# Patient Record
Sex: Female | Born: 1955 | Race: Black or African American | Hispanic: No | Marital: Single
Health system: Southern US, Community
[De-identification: ages and names within clinical notes are randomized; demographics above are authoritative.]

---

## 2011-10-21 ENCOUNTER — Ambulatory Visit: Payer: Self-pay | Admitting: Internal Medicine

## 2011-11-17 ENCOUNTER — Inpatient Hospital Stay: Payer: Self-pay | Admitting: Internal Medicine

## 2011-11-17 LAB — CBC
HGB: 7.8 g/dL — ABNORMAL LOW (ref 12.0–16.0)
MCHC: 32.3 g/dL (ref 32.0–36.0)
MCV: 124 fL — ABNORMAL HIGH (ref 80–100)
Platelet: 15 10*3/uL — CL (ref 150–440)
WBC: 6.2 10*3/uL (ref 3.6–11.0)

## 2011-11-17 LAB — COMPREHENSIVE METABOLIC PANEL
Albumin: 2.5 g/dL — ABNORMAL LOW (ref 3.4–5.0)
Alkaline Phosphatase: 46 U/L — ABNORMAL LOW (ref 50–136)
Anion Gap: 12 (ref 7–16)
Creatinine: 1.07 mg/dL (ref 0.60–1.30)
EGFR (African American): >60
EGFR (Non-African Amer.): 59 — ABNORMAL LOW
Glucose: 138 mg/dL — ABNORMAL HIGH (ref 65–99)
Osmolality: 303 (ref 275–301)
Potassium: 4.1 mmol/L (ref 3.5–5.1)
SGOT(AST): 18 U/L (ref 15–37)
SGPT (ALT): 24 U/L
Total Protein: 5.8 g/dL — ABNORMAL LOW (ref 6.4–8.2)

## 2011-11-17 LAB — URINALYSIS, COMPLETE
Bilirubin,UR: NEGATIVE
Ketone: NEGATIVE
Nitrite: POSITIVE
Ph: 6 (ref 4.5–8.0)
Protein: NEGATIVE
Squamous Epithelial: 2
WBC UR: 106 /HPF (ref 0–5)

## 2011-11-17 LAB — TROPONIN I
Troponin-I: 0.08 ng/mL — ABNORMAL HIGH
Troponin-I: 0.07 ng/mL — ABNORMAL HIGH

## 2011-11-17 LAB — DIFFERENTIAL
Bands: 1 %
Eosinophil: 1 %
Lymphocytes: 52 %
NRBC/100 WBC: 4 /
Segmented Neutrophils: 45 %

## 2011-11-17 LAB — CK TOTAL AND CKMB (NOT AT ARMC)
CK-MB: 0.8 ng/mL (ref 0.5–3.6)
CK-MB: 1 ng/mL (ref 0.5–3.6)

## 2011-11-17 LAB — APTT: Activated PTT: 24.8 secs (ref 23.6–35.9)

## 2011-11-17 LAB — PROTIME-INR: Prothrombin Time: 13.8 secs (ref 11.5–14.7)

## 2011-11-17 LAB — PRO B NATRIURETIC PEPTIDE: B-Type Natriuretic Peptide: 665 pg/mL — ABNORMAL HIGH (ref 0–125)

## 2011-11-18 LAB — COMPREHENSIVE METABOLIC PANEL
Albumin: 2.3 g/dL — ABNORMAL LOW (ref 3.4–5.0)
Alkaline Phosphatase: 45 U/L — ABNORMAL LOW (ref 50–136)
Anion Gap: 12 (ref 7–16)
BUN: 35 mg/dL — ABNORMAL HIGH (ref 7–18)
Calcium, Total: 7.4 mg/dL — ABNORMAL LOW (ref 8.5–10.1)
Chloride: 112 mmol/L — ABNORMAL HIGH (ref 98–107)
Glucose: 280 mg/dL — ABNORMAL HIGH (ref 65–99)
SGPT (ALT): 18 U/L

## 2011-11-18 LAB — CK TOTAL AND CKMB (NOT AT ARMC)
CK, Total: 47 U/L (ref 21–215)
CK-MB: 1 ng/mL (ref 0.5–3.6)

## 2011-11-18 LAB — CBC WITH DIFFERENTIAL/PLATELET
Bands: 11 %
Comment - H1-Com4: NORMAL
HCT: 25.7 % — ABNORMAL LOW (ref 35.0–47.0)
HGB: 8.7 g/dL — ABNORMAL LOW (ref 12.0–16.0)
Lymphocytes: 12 %
MCH: 34.1 pg — ABNORMAL HIGH (ref 26.0–34.0)
MCHC: 34 g/dL (ref 32.0–36.0)
MCV: 100 fL (ref 80–100)
NRBC/100 WBC: 2 /
Platelet: 45 10*3/uL — ABNORMAL LOW (ref 150–440)
RBC: 2.56 10*6/uL — ABNORMAL LOW (ref 3.80–5.20)
RDW: 27.1 % — ABNORMAL HIGH (ref 11.5–14.5)
Segmented Neutrophils: 77 %
WBC: 3.1 10*3/uL — ABNORMAL LOW (ref 3.6–11.0)

## 2011-11-18 LAB — LIPID PANEL
HDL Cholesterol: 29 mg/dL — ABNORMAL LOW (ref 40–60)
VLDL Cholesterol, Calc: 63 mg/dL — ABNORMAL HIGH (ref 5–40)

## 2011-11-18 LAB — HEMOGLOBIN A1C: Hemoglobin A1C: 5.9 % (ref 4.2–6.3)

## 2011-11-19 LAB — CBC WITH DIFFERENTIAL/PLATELET
Basophil #: 0 10*3/uL (ref 0.0–0.1)
Basophil %: 0.9 %
Eosinophil #: 0 10*3/uL (ref 0.0–0.7)
HGB: 7.4 g/dL — ABNORMAL LOW (ref 12.0–16.0)
Lymphocyte #: 0.4 10*3/uL — ABNORMAL LOW (ref 1.0–3.6)
Lymphocyte %: 14.3 %
Monocyte #: 0.2 x10 3/mm (ref 0.2–0.9)
Monocyte %: 5.8 %
Monocytes: 6 %
Neutrophil %: 78.9 %
Platelet: 32 10*3/uL — ABNORMAL LOW (ref 150–440)
RDW: 28.7 % — ABNORMAL HIGH (ref 11.5–14.5)
WBC: 2.9 10*3/uL — ABNORMAL LOW (ref 3.6–11.0)

## 2011-11-19 LAB — IRON AND TIBC
Iron Saturation: 94 %
Unbound Iron-Bind.Cap.: 11 ug/dL

## 2011-11-19 LAB — FIBRINOGEN: Fibrinogen: 479 mg/dL — ABNORMAL HIGH (ref 210–470)

## 2011-11-19 LAB — FOLATE: Folic Acid: 22.7 ng/mL (ref 3.1–100.0)

## 2011-11-19 LAB — FIBRIN DEGRADATION PROD.(ARMC ONLY): Fibrin Degradation Prod.: <10 (ref 2.1–7.7)

## 2011-11-19 LAB — PROTIME-INR
INR: 1.1
Prothrombin Time: 14.2 secs (ref 11.5–14.7)

## 2011-11-19 LAB — APTT: Activated PTT: 28.9 secs (ref 23.6–35.9)

## 2011-11-20 LAB — CBC WITH DIFFERENTIAL/PLATELET
Bands: 6 %
HCT: 30.5 % — ABNORMAL LOW (ref 35.0–47.0)
MCH: 32.7 pg (ref 26.0–34.0)
MCHC: 34 g/dL (ref 32.0–36.0)
Monocytes: 5 %
Platelet: 22 10*3/uL — CL (ref 150–440)
Segmented Neutrophils: 70 %
WBC: 2.9 10*3/uL — ABNORMAL LOW (ref 3.6–11.0)

## 2011-11-21 ENCOUNTER — Ambulatory Visit: Payer: Self-pay | Admitting: Internal Medicine

## 2011-11-21 LAB — CBC WITH DIFFERENTIAL/PLATELET
Basophil #: 0.1 10*3/uL (ref 0.0–0.1)
Basophil %: 1.8 %
Comment - H1-Com3: NORMAL
Eosinophil %: 0.3 %
HCT: 33.7 % — ABNORMAL LOW (ref 35.0–47.0)
Lymphocyte #: 0.8 10*3/uL — ABNORMAL LOW (ref 1.0–3.6)
Lymphocyte %: 25.5 %
Lymphocytes: 29 %
MCV: 96 fL (ref 80–100)
Monocyte #: 0.3 x10 3/mm (ref 0.2–0.9)
Monocyte %: 8.9 %
Monocytes: 10 %
NRBC/100 WBC: 3 /
Neutrophil #: 2.1 10*3/uL (ref 1.4–6.5)
Neutrophil %: 63.5 %
Platelet: 53 10*3/uL — ABNORMAL LOW (ref 150–440)
RBC: 3.5 10*6/uL — ABNORMAL LOW (ref 3.80–5.20)
RDW: 19 % — ABNORMAL HIGH (ref 11.5–14.5)
Variant Lymphocyte - H1-Rlymph: 1 %
WBC: 3.3 10*3/uL — ABNORMAL LOW (ref 3.6–11.0)

## 2011-11-21 LAB — URINALYSIS, COMPLETE
Bacteria: NONE SEEN
Bilirubin,UR: NEGATIVE
Glucose,UR: 50 mg/dL (ref 0–75)
Ketone: NEGATIVE
Nitrite: NEGATIVE
Ph: 7 (ref 4.5–8.0)
RBC,UR: 1 /HPF (ref 0–5)

## 2011-11-23 LAB — CULTURE, BLOOD (SINGLE)

## 2013-05-21 IMAGING — CT CT ABD-PELV W/ CM
1 of 3 series · 14 of 32 positions shown, 19 images · IV contrast (agent unspecified)
Comparison: none

REASON FOR EXAM: (1) severe abdominal pain with IV contrast only; (2)
severe pain This is a stat or
COMMENTS:   LMP: N/A

PROCEDURE:     CT  - CT ABDOMEN / PELVIS  W  - November 17, 2011 [DATE]
RESULT:
TECHNIQUE: Helical 3 mm sections were obtained from the lung bases through
the pubic symphysis status post intravenous administration of 100 mL of
Ysovue-IRR.

[Series 2: 3mm soft tissue · axial · 0.61mm/px · z∈[-588,-174]mm · 14 of 158 slices shown, 19 images]
[im 10/158  soft-tissue]
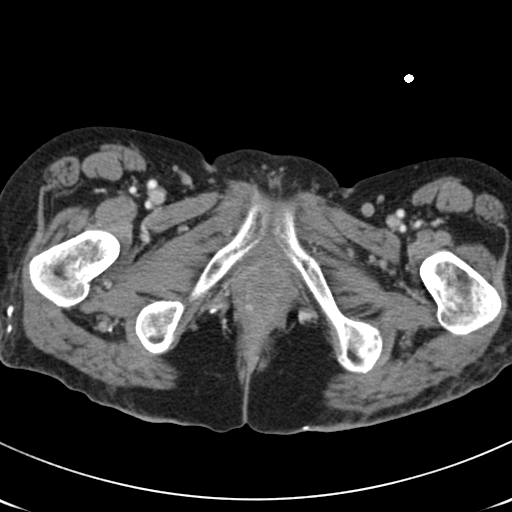
[im 10/158  bone]
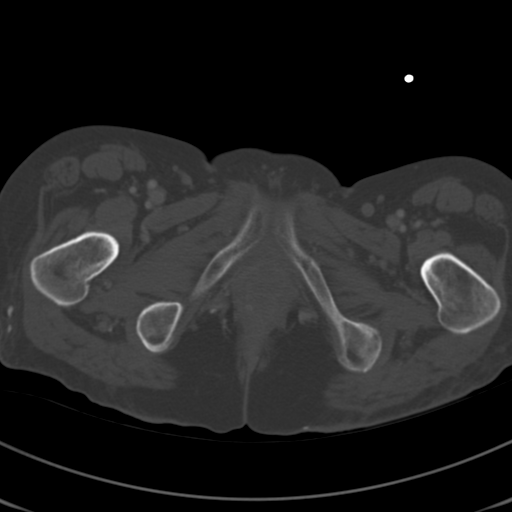
[im 19/158  soft-tissue]
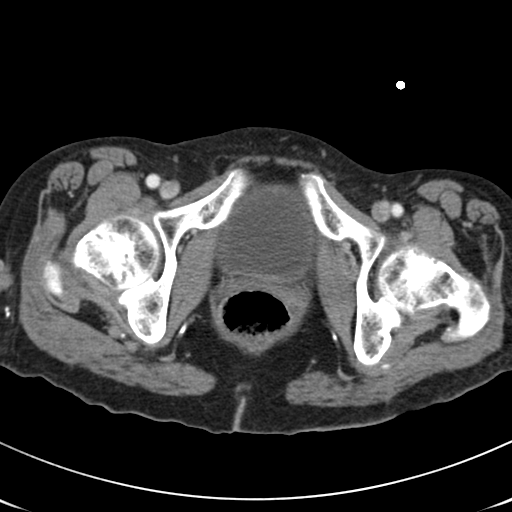
[im 37/158  soft-tissue]
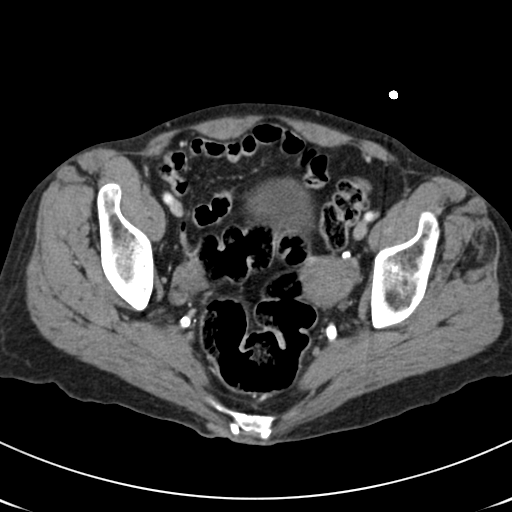
[im 47/158  soft-tissue]
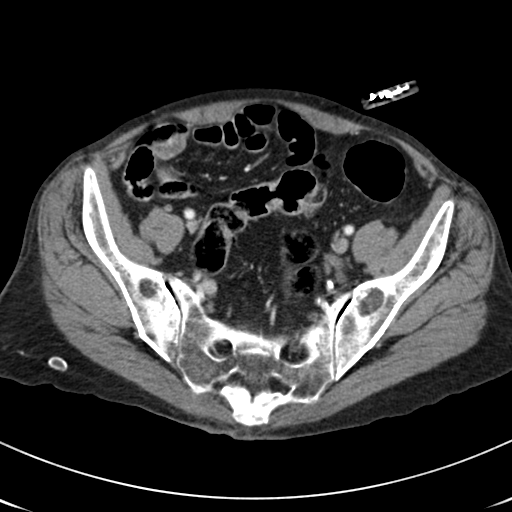
[im 56/158  soft-tissue]
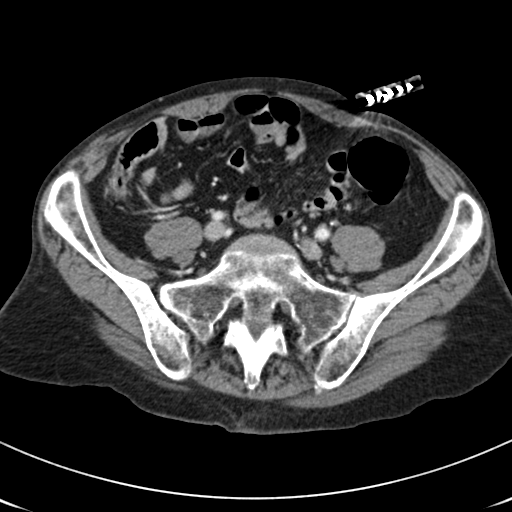
[im 65/158  soft-tissue]
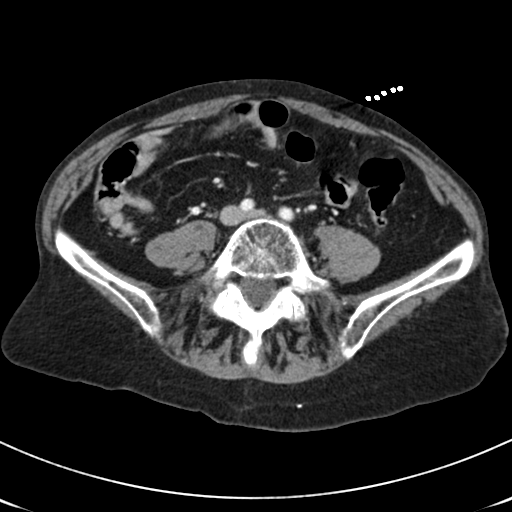
[im 84/158  soft-tissue]
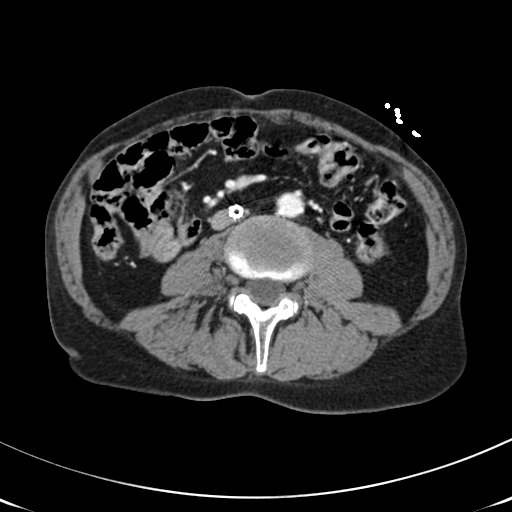
[im 93/158  soft-tissue]
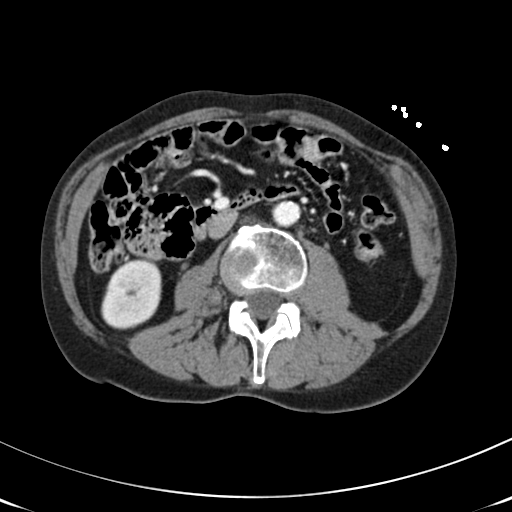
[im 102/158  soft-tissue]
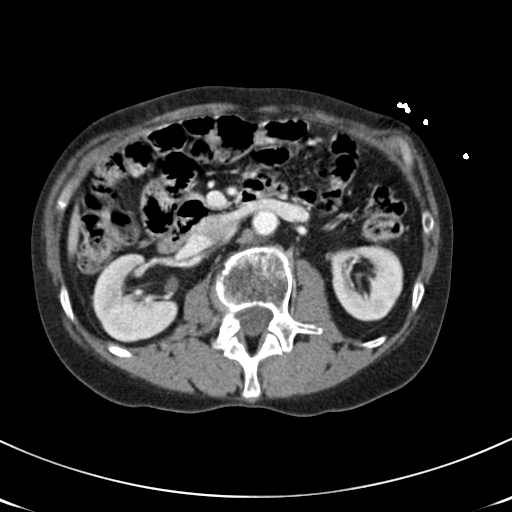
[im 102/158  bone]
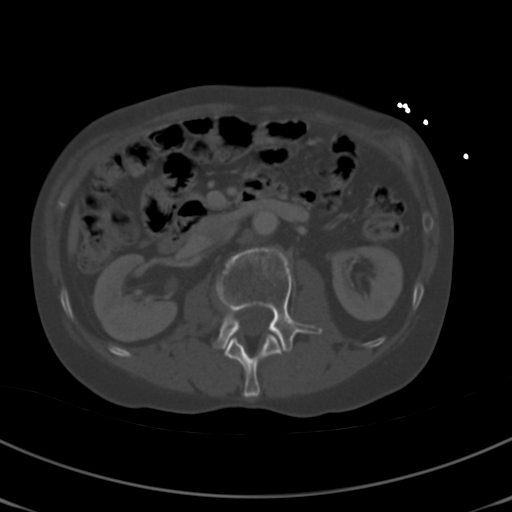
[im 111/158  soft-tissue]
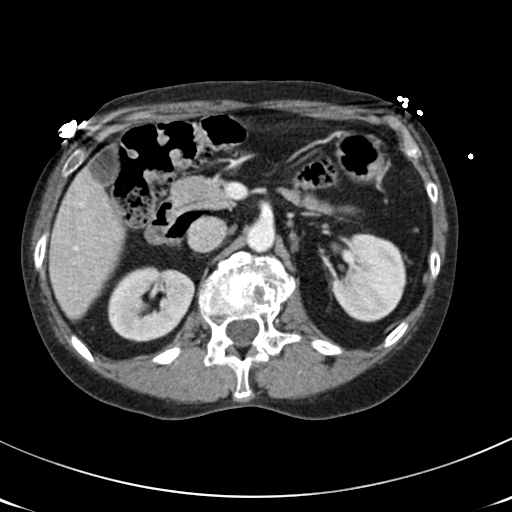
[im 121/158  soft-tissue]
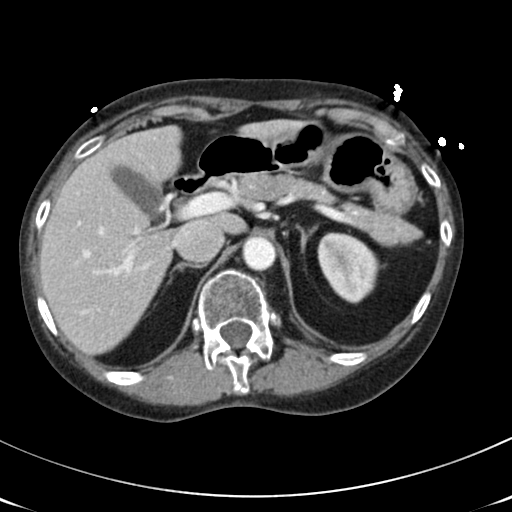
[im 121/158  lung]
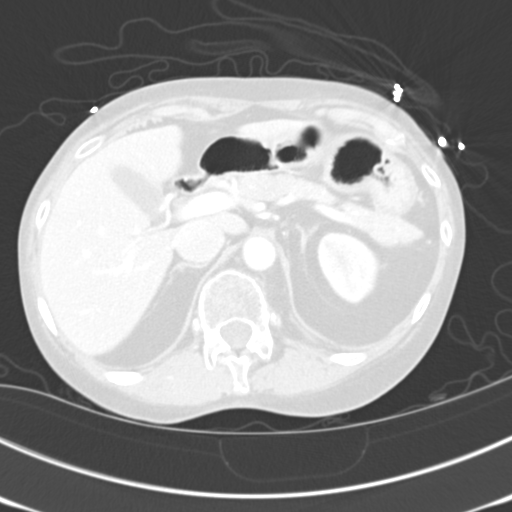
[im 130/158  lung]
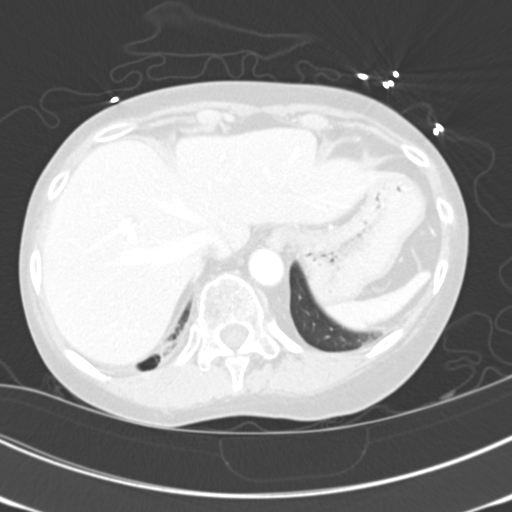
[im 139/158  soft-tissue]
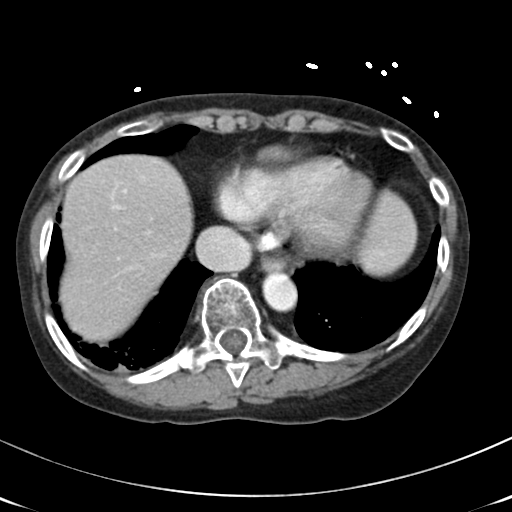
[im 139/158  lung]
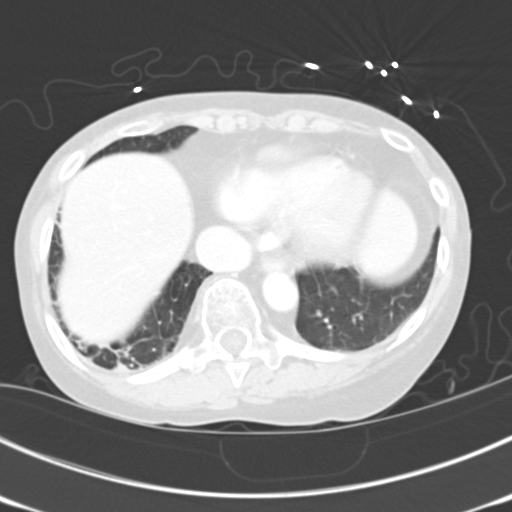
[im 148/158  soft-tissue]
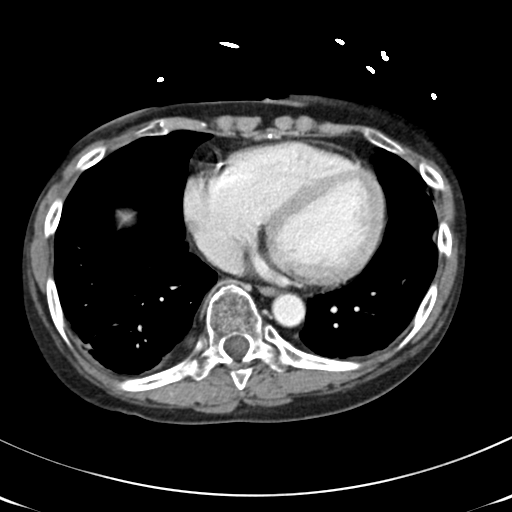
[im 148/158  lung]
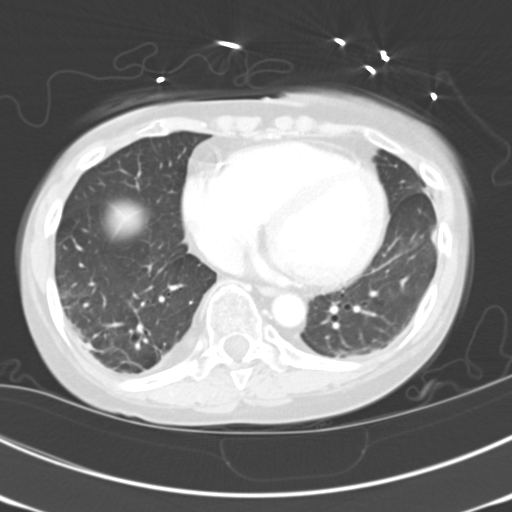

[14 of 32 positions shown; findings below may reference images not displayed]

FINDINGS: Emphysematous changes are identified within the lung bases as well
as interstitial fibrotic changes.

The liver, spleen, adrenals and pancreas are unremarkable. Three to 5 mm low
attenuating nodules are identified within the right and left kidneys like
representing small cysts though are too small for characterization on CT.
The kidneys are otherwise unremarkable. There is no evidence of bowel
obstruction, enteritis, colitis, diverticulitis, nor appendicitis. Coarse
dystrophic calcification is identified within the uterine fundus. The
urinary bladder is distended with urine. There is no evidence of abdominal
or pelvic free fluid, loculated fluid collections nor evidence of free air.
There is no evidence of an abdominal aortic aneurysm. An inferior vena caval
filter is identified. This is in an infrarenal location.
IMPRESSION: 1. The urinary bladder is distended which may represent the patient's need
to void or possibly a component of bladder outlet obstruction, if clinically
appropriate.
2. Otherwise, no evidence of obstructive or inflammatory abnormalities.

## 2014-10-14 NOTE — Consult Note (Signed)
PATIENT NAME:  Hannah Horne, Hannah Horne MR#:  [id number] DATE OF BIRTH:  Oct 21, 1955  DATE OF CONSULTATION:  11/18/2011  REFERRING PHYSICIAN:   CONSULTING PHYSICIAN:  Kandyce RudGeorge W. Kernodle Jr., MD  REASON FOR CONSULTATION: Lupus.  HISTORY OF PRESENT ILLNESS: This is a 59 year old African American female who said she has had lupus since she was a teenager treated mainly with prednisone. She does not remember having had other drugs such as CellCept or Plaquenil or hydroxychloroquine. She has had photosensitivity, but no Raynaud's, no significant pleurisy. She denies any renal disease. She is followed in [place]. She recently had low platelet count by her report. She was seen by a hematologist in [place] and apparently had a bone marrow and told it was okay. She was placed on 60 mg of prednisone and then 40. She traveled to [place] with her family to see her mother. While here she developed an episode of nasal bleeding then coughed up some blood. She had some vaginal spotting. She was admitted hypotensive. Blood cultures were negative. She was given IV antibiotics. She was given IV steroids and platelets as platelet count on admission was 15,000. After transfusion she it was 45,000. She required pressors and now blood pressure has been normal. She had some pyuria. She has not had any recent joint pain, chest pain, skin rash, oral ulcers, fevers, or sweats. She has been on cephalosporin and vancomycin as well as IV Solu-Medrol. She has a history of atrial fibrillation and had atrial fibrillation upon admission.  CURRENT MEDICATIONS:  1. Solu-Medrol 60 mg. 2. Vancomycin. 3. IV Protonix.  4. Cymbalta.   PAST MEDICAL HISTORY:  1. Atrial fibrillation.  2. Diabetes. 3. Chronic obstructive pulmonary disease. 4. Thrombocytopenia.  5. Septic left knee status post procedure.   SOCIAL HISTORY: She previously smoked. No significant alcohol.   FAMILY HISTORY: Negative for connective tissue disease.   REVIEW OF  SYSTEMS: As above.   PHYSICAL EXAMINATION:   GENERAL: Pleasant female in no acute distress, communicative, family in the room.   VITAL SIGNS: Pulse 82, respiratory rate 18, blood pressure 122/74, and oxygenation 100.   SKIN: No significant alopecia or discoid lesions. There are no telangiectasias. She has no sclerodactyly. Sclerae are pale. Oropharynx with some palatal erythema, possibly a little petechiae. No nodes. No thyromegaly.   CHEST: Clear.   HEART: Soft systolic murmur.   ABDOMEN: Nontender abdomen. No visceromegaly. No significant edema.   MUSCULOSKELETAL: Good range of motion of neck and shoulders. Hands without synovitis. Left knee has been operated on, has small cool effusion. Right knee with crepitus. MTPs nontender. She has 5/5 power. Symmetric reflexes. I did not feel a spleen in the right lateral decubitus position.   IMPRESSION:  1. Long-standing lupus, on chronic steroids without renal manifestations by history.  2. Recent onset of thrombocytopenia, status post bone marrow, and was on higher dose prednisone 60 then 40 before traveling to [place].  3. Recent acute thrombocytopenia complicated by bleeding and sepsis and atrial fibrillation, now status post platelet transfusion with platelets at 45,000.   RECOMMENDATIONS: We will get anti-DNA and complement levels. Hopefully her platelet count will stabilize on IV steroid and bleeding can be contained. The decision about other      immunomodulating agents such Imuran or CellCept or rituximab would best be made by her ongoing hematologist and rheumatologist in [place]. ____________________________ Kandyce RudGeorge W. Kernodle Jr., MD gwk:slb D: 11/18/2011 17:13:00 ET T: 11/18/2011 17:48:05 ET JOB#: [id number]  cc: Cyd SilenceGeorge W.  Royann Shivers., MD, <Dictator> Webb Silversmith MD ELECTRONICALLY SIGNED 11/19/2011 12:34

## 2014-10-14 NOTE — Consult Note (Signed)
HEMATOLOGY followup - no new complaints, feels better after PRBBC tx. No bleeding Sxs.no fever. Eating better.A, O x 3, NAD.           vitals - afebrile, stable          lungs - b/l good air entry          abd - soft, NT- WBC 2900, Hb 10.4, platelets 22K, 70% neutrophils. Folate, B12, iron study, PT, PTT, fibrinogen all unremarkable.    59 year old female patient who has been admitted with low-grade fever, hemoptysis, epistaxis and found to have severe thrombocytopenia with platelet count of 15,000 on presentation along with low hemoglobin. She has received packed red blood cell and platelet transfusion yesterday and bleeding symptoms currently have improved, platelet count also doing better at 32K. The patient is from [place], per d/w her Hematologist Dr. 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 She has been getting weekly Procrit and last dose given there was on 5/9. Also, baseline platelet is 25-35K and baseline Hb is 7-8 g with repeatedly positive hemoccult stool. Hb today is better at 10.4 after trasnfusion, will give Procrit 40000 units SQ today. She is planned for d/c after platelet tx today, states she plans to travel back home to [place] this weekend and plans to call Dr.Chalid and see her next friday whhen she would be due for next procrit injection. In between, she was advised to go to nearby ER if recurrent bleeding or acute sickness. She is agreeable to this plan.   Electronic Signatures: Jonn Shingles (MD)  (Signed on 31-May-13 19:17)  Authored  Last Updated: 31-May-13 19:17 by Jonn Shingles (MD)

## 2014-10-14 NOTE — Consult Note (Signed)
PATIENT NAME:  Hannah Horne, Hannah Horne#:  [id number] DATE OF BIRTH:  1955-08-05  DATE OF CONSULTATION:  11/18/2011  REFERRING PHYSICIAN:  Darrick MeigsSangeeta Panwar, MD  CONSULTING PHYSICIAN:  Juwann Sherk R. Sherrlyn HockPandit, MD  REASON FOR CONSULTATION: Thrombocytopenia and anemia.   HISTORY OF PRESENT ILLNESS: The patient is a 59 year old female patient who has been admitted to the hospital on May 28th after she presented with complaints of hemoptysis. 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 The patient was visiting here when she developed sudden onset coughing and hemoptysis along with some nosebleed and presented to the ER. She also had low-grade fever. She is currently on Levaquin. She has been given fresh frozen plasma along with platelet transfusion and packed red blood cell transfusion yesterday. Hemoglobin was 7.8, platelet count 150,000 with bleeding symptoms, WBC 6200. After transfusion, CBC today shows platelet count is better at 45, hemoglobin better at 8.7, WBC 3100 with 77% neutrophils, 2% nucleated red blood cells, creatinine normal at 0.94.   PAST MEDICAL/SURGICAL HISTORY: As in history of present illness above. In addition, left knee surgery for infection going down to the bone.   FAMILY HISTORY: Remarkable for heart disease. Mother had breast cancer.   SOCIAL HISTORY: Ex-smoker, quit 10 to 12 years ago. Denies alcohol or recreational drug usage. The patient is disabled.   ALLERGIES: Prilosec, Septra, penicillin, propoxyphene.   HOME MEDICATIONS:  1. Calcium gluconate 500 mg b.i.d.  2. Multivitamin 1 tablet daily.  3. Combivent p.r.n.  4. Cymbalta 30 mg daily. 5. Ferrous sulfate 1 tablet daily.  6. Folic acid 1 mg daily. 7. Glipizide 5 mg b.i.d.   8. Lopressor 25 mg b.i.d.  9. Omeprazole 20 mg 2 capsules once daily.  10. Potassium chloride 10 mEq daily.  11. Prednisone 10 mg daily.   REVIEW OF SYSTEMS: CONSTITUTIONAL: Generalized weakness, feels tired. Otherwise, denies fever or chills. HEENT: Denies any headaches or dizziness at rest. No epistaxis currently, has improved. No ear or jaw pain. CARDIAC: No angina, palpitation, orthopnea, or PND. LUNGS: Has dyspnea on exertion. No cough, chest pain, or hemoptysis. GI: No nausea, vomiting, or diarrhea. No bright red blood stools or melena. GU: No dysuria or hematuria. MUSCULOSKELETAL: Has generalized pain all over. No new bone pains specifically otherwise. NEUROLOGIC: Denies any new headaches, loss of consciousness, or seizures. No new focal weakness. ENDOCRINE: No polyuria or polydipsia. Appetite is fair.   PHYSICAL EXAMINATION:   GENERAL: The patient is a moderately built and nourished individual, resting in bed. Otherwise, alert and oriented in no acute distress. No icterus. Pallor present.   VITAL SIGNS: T-max 100.3, heart rate 98, respiratory rate 18, blood pressure 135/86, 98% on room air.   HEENT: Normocephalic, atraumatic. Extraocular movements intact. Sclerae anicteric. No oral thrush or petechiae.   NECK: Supple without lymphadenopathy.  CARDIOVASCULAR: S1, S2, regular rate and rhythm.   LUNGS: Bilateral good air entry, decreased at bases. No rhonchi.   ABDOMEN: Soft, nontender. No hepatosplenomegaly clinically.   LYMPHATIC: No adenopathy in the neck, axillary, or inguinal areas.   SKIN: No generalized rashes or major bruising.   EXTREMITIES: No major swelling or cyanosis.   LABORATORY, DIAGNOSTIC, AND RADIOLOGICAL DATA: As in history of present illness. In addition, WBC today is 3100, hemoglobin 8.7, platelets 45, bands 11, neutrophils 77, nucleated red blood cells  2%, creatinine 0.94, calcium 7.4. Liver functions mostly unremarkable except albumin of 2.3.   CT of the  abdomen and pelvis May 28th reported spleen and liver unremarkable.   IMPRESSION AND RECOMMENDATIONS: This is a 59 year old female patient who has been admitted with low-grade fever, hemoptysis, epistaxis and found to have severe thrombocytopenia with platelet count of 15,000 on presentation along with low hemoglobin. She has received packed red blood cell and platelet transfusion yesterday and bleeding symptoms currently have improved, platelet count also doing better at 45,000. 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 Agree with ongoing transfusion support. Will get further anemia work-up including B12 and folate level, iron study including iron and TIBC, manual differential, direct Coombs test. Will follow-up after above work-up results are available and make further recommendation. Will also check PT, PTT, fibrinogen and FDP to look for evidence of DIC or coagulopathy. Will try to obtain records from hematologist in [place] regarding previous work-up and diagnosis. The patient also plans to return to [place] as soon as she gets better from acute sickness/symptoms.   Thank you for the referral. Please feel free to contact me if any additional questions.   ____________________________ Maren Reamer Sherrlyn Hock, MD srp:drc D: 11/19/2011 00:49:25 ET T: 11/19/2011 06:43:57 ET JOB#: [id number]  cc: Darryll Capers R. Sherrlyn Hock, MD, <Dictator> Wille Celeste MD ELECTRONICALLY SIGNED 11/19/2011 12:01

## 2014-10-14 NOTE — H&P (Signed)
PATIENT NAME:  Hannah Horne, Hannah Horne MR#:  [id number] DATE OF BIRTH:  08-Aug-1955  DATE OF ADMISSION:  11/17/2011  ER REFERRING PHYSICIAN:  Dr. Mayford Knife  PRIMARY CARE PHYSICIAN/RHEUMATOLOGIST: Dr. Delilah Shan (Tele: [phone]; located in [place])  HEMATOLOGIST: Roni Bread, MD (Tele: [phone]; located in [place])  CHIEF COMPLAINT: Hemoptysis.   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 The patient was scheduled to go back today. However, last night she developed sudden onset of coughing and subsequently developed hemoptysis and coughed up several blood clots. She also started having some nosebleed which started last night. She denies any fever, chest pain, or cough. She has chronic abdominal pain which is not any worse at present. She also has chronic vaginal bleeding and sees an OB/GYN physician in [place]. She is scheduled for some kind of surgery for her vaginal bleeding. The patient is not very aware of all her medical problems. I tried calling the patient's primary care physician/rheumatologist, Dr. Delilah Shan, at [phone]; however, their office was closed for the [facility] Day long weekend and will reopen tomorrow. The patient denies any sick contacts. She denies any other problems. She knows she has low platelets and has received blood transfusion and platelets in the past.   ALLERGIES: Penicillin, Prilosec, Septra, propoxyphene.  MEDICATIONS:  1. Calcium gluconate 500 mg twice a day.  2. MVI 1 tablet daily.  3. Combivent p.r.n. 4. Cymbalta 30 mg daily.  5. Ferrous sulfate 1 tablet daily.  6. Folic acid 1 mg daily.  7. Glipizide 5 mg twice a day. 8. Lopressor 25 mg twice a day. 9. Omeprazole 20 mg two capsules once a day.  10. KCl 10  milliequivalents daily.  11. Prednisone 10 mg daily.   PAST MEDICAL HISTORY:  1. Ongoing vaginal bleeding for which the patient sees an OB/GYN. As per the patient, she is being prepared by her PCP, rheumatologist, and OB/GYN for some kind of procedure for her vaginal bleeding. 2. Pancytopenia. 3. 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  4. Atrial fibrillation.  5. Neuropathy. 6. Diabetes. 7. Emphysema. 8. Anemia. 9. Gastroesophageal reflux disease.  10. Lupus.  PAST SURGICAL HISTORY: Left knee surgery for infection going down to the bone. The patient reports that she did not have a knee placement. Her knee was opened up and cleaned out.   FAMILY HISTORY: Mother had breast cancer. Father had heart disease.   SOCIAL HISTORY: The patient quit smoking 11 years ago. Denies any alcohol abuse. She is disabled. She lives in [place] and is currently visiting her sister.   REVIEW OF SYSTEMS: CONSTITUTIONAL: Denies any fever. Reports chronic fatigue.  EYES: Denies any blurred or double vision. ENT: Denies any tinnitus or ear pain. RESPIRATORY: Reports cough and hemoptysis. Denies any wheezing. CARDIOVASCULAR: Denies any chest pain or palpitations. GI: Denies any nausea, vomiting, or diarrhea. GU: Denies any dysuria or hematuria. GYN: Reports ongoing vaginal bleeding for which she has to use a panty liner. ENDOCRINE: Denies any polyuria or nocturia. HEME/LYMPH: Has history of anemia and bleeding in the past. INTEGUMENT: Denies any acne or rash. MUSCULOSKELETAL: Denies any swelling or gout. NEUROLOGICAL: Denies any numbness or weakness. PSYCH: Denies any anxiety or depression.    PHYSICAL EXAMINATION:   VITAL SIGNS: Temperature 96.8, heart rate is ranging from 130 to 1.60, respiratory rate 18, blood pressure  90/60, and pulse oximetry 97%.   GENERAL: The patient is a 59 year old pale chronically ill-appearing female lying in bed not in acute 71distress.   HEAD: Atraumatic, normocephalic.    EYES: There is pallor. No icterus or cyanosis. Pupils are equal, round, and reactive to light and accommodation.   ENT: Again, there is pallor. No oropharyngeal erythema or thrush.   NECK: Supple. No masses. No JVD. No thyromegaly or lymphadenopathy.   CHEST WALL: No tenderness to palpation. Not using accessory muscles of respiration. No intracostal muscle retractions.   LUNGS: Bilaterally clear to auscultation. No wheezing, rales, or rhonchi.  CARDIOVASCULAR: Irregularly irregular. There is a systolic murmur. No rubs or gallops.   ABDOMEN: Soft, nontender, and nondistended. No guarding or rigidity. No organomegaly. Normal bowel sounds.   SKIN: No rashes or lesions.   PERIPHERIES: Trace pedal edema. 2+ pedal pulses.   MUSCULOSKELETAL: No cyanosis or clubbing.   NEUROLOGICAL: Awake, alert, and oriented x3. Nonfocal neurological exam. Cranial nerves grossly intact.   PSYCH: Normal mood and affect.   RESULTS: Chest x-ray shows no acute abnormalities.   BNP 665. CK 31. Troponin 0.08. White count 6.2, hemoglobin 7.8, hematocrit 24.1, platelets 15. The patient has macrocytosis. Glucose 138, BUN 40, creatinine 1.07, sodium 146, potassium 4.1, chloride 107, CO2 27, calcium 7.9. Bilirubin 0.3, ALT 46, ALT 24, AST 18, total protein 5.8, albumin 2.5. PT- INR and PTT normal.  ASSESSMENT AND PLAN: A 59 year old female with history of multiple medical problems including lupus, pancytopenia, anemia, idiopathic thrombocytopenia purpura, atrial fibrillation, and diabetes who presents with hemoptysis.  1. Hemoptysis: This is likely due to critical thrombocytopenia. The patient's platelet count is 15,000.  She is also immunocompromised. She denies any sick contacts, but it is unclear what the trigger for her cough was, which resulted in hemoptysis. A chest x-ray is negative, but given her immunocompromised status, we will get blood cultures and start on empiric antibiotics and p.r.n. respiratory  treatments.  2. Atrial fibrillation with rapid ventricular response: The patient's heart rate is ranging from 130 to 160. She cannot get any anticoagulant due to her critical thrombocytopenia. We will start her on a Cardizem drip and also on p.o. Lopressor. We will try and wean off the drip. The patient will be admitted to the Intensive Care Unit. 3. Anemia and thrombocytopenia: The patient is not clear about her medical problems. This could be idiopathic thrombocytopenia purpura due to lupus versus MDS. We will transfuse 2 units of blood, 1 unit of FFP, and 1 pack of platelets. The platelets will have to arrive [facility]. We will get a hematology consultation.  4. Lupus with possible idiopathic thrombocytopenia purpura: The patient reports that her platelets usually run low but does not know how low they usually run. It is unclear what the reason for the patient's critical thrombocytopenia currently is. It could be due to lupus flare. I tried calling the patient's PCP/rheumatologist, Dr. Delilah Shan, at [phone]; however, his office is closed for the [facility] Day holiday weekend. We will continue the patient's prednisone and also get a rheumatology consult.  5. Vaginal bleeding: The patient sees an OB/GYN in [place]. The patient reports that her specialist, PCP and OB/GYN are preparing her for some kind of surgery to deal with this problem. 6. Neuropathy: We will continue Cymbalta. 7. Diabetes: We will place on insulin sliding scale and ADA diet and continue glipizide. 8. History of chronic obstructive pulmonary disease, emphysema: We will continue Combivent and p.r.n. nebulizer  treatments. 9. Mild dehydration: We will give the patient some fluids since her blood pressure is also borderline low.  10. Elevated troponin likely due to demand ischemia: The patient denies any chest pain. We will check serial cardiac enzymes. We will continue beta blocker. The patient is not a candidate for any  anticoagulation given her ongoing bleeding, hemoptysis, and low platelet count. She cannot get any other kind of antihypertensives either due to borderline low blood pressure.  I discussed with the ED physician and discussed with the patient and her family the plan of care and management.   CRITICAL CARE TIME SPENT: 60 minutes.  ____________________________ Darrick MeigsSangeeta Shoua Ressler, MD sp:slb D: 11/17/2011 15:20:36 ET T: 11/17/2011 15:59:19 ET JOB#: [id number]  cc: Darrick MeigsSangeeta Arryana Tolleson, MD, <Dictator> Dr. Delilah ShanBrodman (Tele: [phone]; located in [place]) Darrick MeigsSANGEETA Dotty Gonzalo MD ELECTRONICALLY SIGNED 11/18/2011 15:00

## 2014-10-14 NOTE — Consult Note (Signed)
Brief Consult Note: Patient was seen by consultant.   Consult note dictated.   Orders entered.   Comments: 1. long standing SLE on chronic steroids 2. recent thrombocytopenia , s/p bone marrow on steriod boost, followed by heme in [place] 3. recent nasal, vaginal, ?GI bleeding ? hemoptysis. now s/p platelet transfusion               agree with management of bleeding, IV steroids . may need other immunosuppressant therapy but that would be best decided by her regular hematologist and rheumatologist.willcheck anti dna and complement.  Electronic Signatures: Royann ShiversKernodle, Jr., Helen HashimotoGeorge Wallace (MD)  (Signed [id number] 522729-May-13 17:05)  Authored: Brief Consult Note   Last Updated: 29-May-13 17:05 by Royann ShiversKernodle, Jr., Helen HashimotoGeorge Wallace (MD)

## 2014-10-14 NOTE — Consult Note (Signed)
PATIENT NAME:  Hannah Horne, Hannah Horne MR#:  [id number] DATE OF BIRTH:  [birth year]  DATE OF CONSULTATION:  11/18/2011  REFERRING PHYSICIAN:   CONSULTING PHYSICIAN:  Payton Emerald, NP  PRIMARY CARE PHYSICIAN:  Dr. Sharlee Blew, [place] HEMATOLOGIST:  Dr. Osvaldo Angst, [place]  REASON FOR CONSULTATION: GI bleed, anemia, thrombocytopenia.   HISTORY OF PRESENT ILLNESS: Ms. 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 Medical history is also significant for deep vein thrombosis for which she has had an IVC filter in place since the age of 25. The patient states that on Monday of this week she started to "spit up bright red blood. She said she was also experiencing hemoptysis. No associated nausea. No vomiting, no abdominal pain. Normal bowel pattern is every other day. No evidence of bright red blood or melena initially. Questionable evidence of melena at this time as she does note evidence of darker color presence with cleansing after voiding earlier today. The patient had an EGD done at [facility] in [place], [place] in July 2010 which she states was unremarkable. Colonoscopy was also done within the past couple of years, which she also states was unremarkable. Mild discomfort to mid abdomen at this time. Denies any aspirin, anticoagulant or antiplatelet therapy, or NSAID use.   ALLERGIES: Penicillin, Prilosec, Septra, and propoxyphene.   MEDICATIONS:  1. Calcium gluconate 500 mg twice a day.  2. Multivitamin once a day. 3. Combivent as directed as needed.  4. Cymbalta 30 mg a day.  5. Ferrous sulfate 1 tablet daily.  6. Folic acid 1 mg tablet daily.  7. Glipizide 5 mg twice a day.  8. Lopressor 25 mg twice a day.  9. Omeprazole 20 mg, 2 capsules once a day.  10. Potassium 10 mEq daily.   11. Prednisone 10 mg a day. 12. Percocet 5 mg, 1 tablet as directed as needed.   PAST MEDICAL HISTORY:  1. Ongoing vaginal bleeding for which she is under the care of OB/GYN in [place]. Pancytopenia. 2. 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 3. Atrial fibrillation.  4. Neuropathy.  5. Diabetes.  6. Emphysema.  7. Anemia.  8. Gastroesophageal reflux disease.  9. Lupus,  10. Multiple deep vein thromboses status post IVC filter placement at the age of 89.   PAST SURGICAL HISTORY:  1. Left knee surgery for osteomyelitis. 2. IVC filter.   FAMILY HISTORY: Mother history of breast cancer. Father history of heart disease. No colon cancer or colonic polyps in the family. Sister with history of breast cancer status post bilateral mastectomy.   SOCIAL HISTORY: Remote tobacco use, quit 11 years ago. Occasional alcohol consumption. No heavy use. No recreational drug use. Resides in [place], visiting her sister.  REVIEW OF SYSTEMS: CONSTITUTIONAL: Denies any fevers. Reports chronic fatigue.  EYES: No blurred vision, double vision. Does wear glasses. ENT: No tinnitus or ear pain. Significant for epistaxis which was present at the time of hemoptysis. RESPIRATORY: Significant for cough and hemoptysis. Denies any wheezing. CARDIOVASCULAR: Denies any chest pain or heart palpitations. GI: See history of present illness. GU: Denies any dysuria or hematuria. GYN: Ongoing vaginal bleeding. ENDOCRINE: Denies any polyuria or nocturia.  HEME/LYMPH: History of anemia and bleeding in the past. INTEGUMENT: Denies acne or rashes. MUSCULOSKELETAL: Denies any swelling or gout. NEUROLOGIC: Significant for weakness and fatigue. PSYCH: No anxiety or depression.  PHYSICAL EXAMINATION:  VITAL SIGNS: Temperature 100, pulse is 76, respirations 18, blood pressure 131/74, and pulse oximetry 98%.   GENERAL: Well developed, well nourished 59 year old [place] American female, no acute distress noted.  Pleasant, appears to be resting comfortably in bed. Odor of active gastrointestinal bleed. Visualization of sheets noted scant amount of black-colored feces.   HEENT: Normocephalic, atraumatic. Pupils equal, reactive to light. Conjunctivae clear. Sclerae anicteric.   NECK: Supple. No JVD.   PULMONARY: Symmetric rise and fall of chest. Clear to auscultation throughout.   CARDIOVASCULAR:  S1, S2, irregular. 6-2/2 systolic murmur.  ABDOMEN: Soft, nondistended. Bowel sounds in four quadrants. No bruits. No masses.   RECTAL: Deferred.   MUSCULOSKELETAL: Movement of all four extremities. No contractures. No clubbing.   SKIN: Warm, dry. No lesions or rashes.   NEUROLOGICAL: No gross neurological deficits.   LABORATORY, DIAGNOSTIC, AND RADIOLOGICAL DATA: Chemistry panel on admission: Glucose 138, BUN 40, sodium was 146, eGFR was greater than 60. B-type natriuretic peptide was 665. Calcium was 7.9 on admission, has declined to 7.4. Glucose has risen to 280 with a BUN of 35 today, and sodium remains at 146. Hepatic panel: Total protein was 5.8, albumin 2.5, alkaline phosphatase is low at 46. AST and ALT within normal limits. On 05/29 total protein is 5.4 with an albumin of 2.3. Cardiac enzymes, first in series: CK total 31 with a CK-MB of 0.8, troponin 0.08; second in series: CK total 50 with a CK-MB 1 and troponin 0.07; third in series within normal limits. On admission initially, RBC 1.94, hemoglobin 7.8, hematocrit 24.1 with a platelet count of 15,000. MCV was 124 with an MCH 40.1 and RDW of 26.1.  On 05/29 white count is 3.1 with RBC of 2.56. Hemoglobin 8.7, hematocrit 25.7, platelet count improved to 45,000, MCH is 34.1 with an RDW 27.1.   Antibody screen is positive. The patient has received 3 units of packed red blood cells. Blood cultures times two: No growth in 8 to 12 hours. Urinalysis reveals 2+ blood, positive for nitrites, leukocytes +3, RBCs 7 per high-power field with WBC 106 per  high-power field, +1 bacteria. EKG: Sinus tachycardia with short PR with premature ventricular complexes. A CT of abdomen and pelvis with contrast revealed urinary bladder is distended, otherwise unremarkable. Chest single view on admission: No acute changes were identified.   IMPRESSION:  1. Hemoptysis.  2. Hematemesis.  3. Severe anemia.  4. 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 5. History of atrial fibrillation.  6. Epistaxis.   PLAN: The patient's presentation was discussed with Dr. Verdie Shire. At this time feel that endoscopic evaluation is not warranted, as possible evidence of melena as well as a noted drop in hemoglobin and hematocrit as well as spitting up blood may be in correlation with epistaxis which has occurred. If epistaxis should recur, do recommend the patient having an ENT consultation. At this time we will continue to monitor the patient.    These services provided by Ebony Cargo, NP in collaborative agreement with Dr. Verdie Shire.   ____________________________ Payton Emerald, NP dsh:bjt D: 11/18/2011 14:40:39 ET T: 11/18/2011 15:43:37 ET JOB#: [id number]  cc: Payton Emerald, NP, <Dictator> Payton Emerald MD ELECTRONICALLY SIGNED 11/25/2011 15:51

## 2014-10-14 NOTE — Consult Note (Signed)
HEMATOLOGY followup note - overall feels better. No bleeding Sxs.no fever. Eating better.A, O x 3, NAD. Pallor +          vitals - afebrile, stable          lungs - b/l good air entry          abd - soft, NT- WBC 2900, Hb 7.4, platelets 32K, ANC 2300. Folate, iron study, PT, PTT, fibrinogen all unremarkable. B12 level pending.   59 year old female patient who has been admitted with low-grade fever, hemoptysis, epistaxis and found to have severe thrombocytopenia with platelet count of 15,000 on presentation along with low hemoglobin. She has received packed red blood cell and platelet transfusion yesterday and bleeding symptoms currently have improved, platelet count also doing better at 32K. The patient is from [place], per d/w her Hematologist Dr. 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 She has been getting weekly Procrit and last dose given there was on 5/9. Also, baseline platelet is 25-35K and baseline Hb is 7-8 g with repeatedly positive hemoccult stool. Hb today is again low, agree with supportive PRBC transfusion, platelet count is steady at 32K, monitor and transfuse if platelet count < 20K or at higher counts if major bleeding Sxs. Will f/u tomorrow and plan next Procrit injection. The patient also plans to return to [place] as soon as she gets better from acute sickness/symptoms.    Electronic Signatures: Jonn Shingles (MD)  (Signed on 31-May-13 01:23)  Authored  Last Updated: 31-May-13 01:23 by Jonn Shingles (MD)

## 2014-10-14 NOTE — Discharge Summary (Signed)
PATIENT NAME:  Hannah Horne, Hannah Horne MR#:  [id number] DATE OF BIRTH:  October 07, 1955  DATE OF ADMISSION:  11/17/2011 DATE OF DISCHARGE:  11/21/2011  ADDENDUM  Please see discharge summary dated 11/20/2011 by Dr. Juliene PinaMody.   The reason why the patient was not discharged on 05/31 and discharged 06/01 was that she had a platelet transfusion and developed itching reaction. A blood transfusion work-up was done by the blood pathologist. No major reaction noted. Appears to be a nonfebrile nonanaphylactic allergic reaction. Symptomatic treatment with antihistamines recommended. Since she was here on 06/01 we did get a CBC with a white blood cell count of 3.3, hemoglobin 11.4, platelet count up to 53,000. Patient was seen by the oncologist here who recommended follow up with her oncologist on Friday, for another Procrit injection at that time and close clinical follow-up of the blood counts. Patient was deemed medically stable for discharge on 06/01. Her family member is going to drive her back to [place] tomorrow. I believe that this will be okay to do so. Please see discharge summary by Dr. Juliene PinaMody and discharge instructions by Dr. Juliene PinaMody.   PE : Vital stable, lungs clear, CVS- s1 s2 normal no murmer, HEENT- no signs of bleeding, Abdomen- soft nontender, ext no edema, skin- bruising extremities  This is a low level discharge less than 30 minutes since most of the work was already done yesterday by Dr. Juliene PinaMody.   ____________________________ Herschell Dimesichard J. Renae GlossWieting, MD rjw:cms D: 11/21/2011 14:49:32 ET T: 11/21/2011 14:54:25 ET JOB#: [id number]  cc: Herschell Dimesichard J. Renae GlossWieting, MD, <Dictator> Salley ScarletICHARD J Donyel Castagnola MD ELECTRONICALLY SIGNED 11/22/2011 14:07

## 2014-10-14 NOTE — Discharge Summary (Signed)
PATIENT NAME:  Hannah Horne, Hannah Horne MR#:  [id number] DATE OF BIRTH:  1956/04/17  DATE OF ADMISSION:  11/17/2011 DATE OF DISCHARGE:  11/20/2011  ADMISSION DIAGNOSES:  1. Hypotension.  2. Hemoptysis.  3. Atrial fibrillation with rapid ventricular response.   DISCHARGE DIAGNOSES:  1. Hypotension from anemia and acute blood loss.  2. Hemoptysis.  3. Atrial fibrillation with rapid ventricular response.  4. Acute on chronic anemia, acute blood loss, with hemoglobin baseline  of 7 to 8.  5. Thrombocytopenia with history of idiopathic thrombocytopenia purpura and myelodysplasia.  6. History of lupus. 7. Vaginal bleeding, ongoing.  8. Urinary tract infection.   CONSULTANTS: 1. Saverio Danker, MD. 2. Janese Banks, MD.  LABORATORIES AT DISCHARGE: White blood cells 2.9, hemoglobin 10.4, hematocrit 31, platelets 22.   Vitamin B12 449. TIBC 178, Serum iron 167, serum saturation 94. Complement 3 and 4 are all normal. Direct Coombs is negative. Fibrin degradation product less than 10. Fibrinogen 479. Anti double-stranded DNA is pending.   HOSPITAL COURSE: This is a 59 year old female who presented with hypotension and hemoptysis. For further details, please refer to the history and physical. 1. Hypotension, likely from anemia/acute blood loss on Cardizem.  The patient was initially on pressors. As she received a blood transfusion, Cardizem was stopped. Her blood pressure improved. Her blood culture is negative to date. She was initially placed on broad-spectrum antibiotics, which we discontinued.  2. Hemoptysis due to critical thrombocytopenia, resolved.  3. Atrial fibrillation with rapid ventricular response. The patient converted to normal sinus rhythm and was not in atrial fibrillation during the hospitalization. 4. Acute on chronic anemia with blood loss. The patient did receive 4 units of pure RBCs. Her baseline hemoglobin was 7 to 8. Hematology/oncology was consulted. Consult was appreciated. They did  get in touch with the patient's hematologist/oncologist. The patient has a history of myelodysplasia. They are considering chemotherapy.  5. Thrombocytopenia with history of idiopathic thrombocytopenia purpura. The patient received 2 packs of platelets and one of those was right before she was discharged. She had no evidence of hemoptysis or bleeding during hospitalization.  6. Lupus. Appreciate Dr. Guillermina [place] consult. He did get in touch with the patient's rheumatologist. The patient will be on prednisone 40 mg daily and will see a rheumatologist on Monday when she goes back to [place].  7. Vaginal bleeding, which is ongoing. She sees an OB in [place] and is getting ready for some kind of surgery.  8. Urinary tract infection. The patient is on Levaquin at discharge.   DISCHARGE MEDICATIONS:  1. Prednisone 40 mg daily.  2. Levaquin 500 mg daily for four days.  3. Potassium chloride 10 mEq daily.  4. Glipizide 5 mg twice a day.  5. Metoprolol 25 mg twice a day.  6. Calcium gluconate 500 mg twice a day. 7. Combivent as needed.  8. Cymbalta 30 mg daily.  9. Folic acid 1 mg daily.  10. Centrum Silver 1 tablet daily.  11. Ferrous sulfate 1 tablet daily.  12. Omeprazole 20 mg 2 tablets daily.  13. Norco 1 tablet every six hours p.r.n. pain, #20.  NOTE: Do not take NSAIDs or aspirin.   DISCHARGE DIET: Low sodium, ADA diet.   DISCHARGE ACTIVITY: No exertion or heavy lifting.   DISCHARGE FOLLOWUP: The patient will follow-up with a rheumatologist and hematologist in [place] on Monday, 11/23/2011.  TIME SPENT ON DISCHARGE: 35 minutes. ____________________________ Janyth Contes. Juliene Pina, MD spm:slb D: 11/20/2011 13:37:40 ET T: 11/20/2011 14:11:12 ET JOB#:  [id number]  cc: Shuna Tabor P. Juliene PinaMody, MD, <Dictator> Janyth ContesSITAL P Kristalynn Coddington MD ELECTRONICALLY SIGNED 11/27/2011 12:41

## 2014-10-14 NOTE — Consult Note (Signed)
Brief Consult Note: Diagnosis: Hemopytsis with hematemesis.  Severe anemia and thrombocytopenia.  Epistaxis possible underlying cause of hematemesis as well as noted drop in H/H and melena.  Known hematological history as stated under medical history.  Platelet count 15,000 on admission..   Consult note dictated.   Discussed with Attending MD.   Comments: Patient's presentation discussed with Dr. Lutricia FeilPaul Oh.  Recommendation at this time is to continue to monitor.  Do not feel patient warrants endoscopic evaluation as noted drop in H/H as well as melena are probable in correlation with epistaxis.  If nose bleed should reoccur would recommend ENT evaluation.  Will continue to monitor..  Electronic Signatures: Rodman KeyHarrison, Terrea Bruster S (NP)  (Signed [phone] [id number]-May-13 14:43)  Authored: Brief Consult Note   Last Updated: 29-May-13 14:43 by Rodman KeyHarrison, Dominique Calvey S (NP)

## 2014-10-14 NOTE — Consult Note (Signed)
Brief Consult Note: Comments: discussed [facility] Delilah [facility]Brodman[facility]. hx of Burned out SLE since teen age. 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 managed by heme MD Dr Tereso NewcomerKalhid  [phone] with transfusions. prior bleeding episodes, prior endoscopy, was to have gyn procedure after platelet transfusion. recent boost of pred 10 to 60 to 40 without any response . recommends support with transfusions and fup with her heme.  Electronic Signatures: Royann ShiversKernodle, Jr., Helen HashimotoGeorge Wallace (MD)  (Signed [id number]-May-13 16:58)  Authored: Brief Consult Note   Last Updated: 30-May-13 16:58 by Royann ShiversKernodle, Jr., Helen HashimotoGeorge Wallace (MD)
